# Patient Record
Sex: Male | Born: 1945 | Race: White | Hispanic: No | Marital: Married
Health system: Southern US, Community
[De-identification: ages and names within clinical notes are randomized; demographics above are authoritative.]

---

## 2006-06-28 ENCOUNTER — Encounter: Admission: RE | Admit: 2006-06-28 | Discharge: 2006-06-28 | Payer: Self-pay | Admitting: Internal Medicine

## 2007-03-22 ENCOUNTER — Ambulatory Visit: Admission: RE | Admit: 2007-03-22 | Discharge: 2007-06-20 | Payer: Self-pay | Admitting: Radiation Oncology

## 2007-03-25 ENCOUNTER — Encounter: Admission: RE | Admit: 2007-03-25 | Discharge: 2007-03-25 | Payer: Self-pay | Admitting: Urology

## 2007-05-26 ENCOUNTER — Ambulatory Visit (HOSPITAL_BASED_OUTPATIENT_CLINIC_OR_DEPARTMENT_OTHER): Admission: RE | Admit: 2007-05-26 | Discharge: 2007-05-26 | Payer: Self-pay | Admitting: Urology

## 2007-06-27 ENCOUNTER — Ambulatory Visit: Admission: RE | Admit: 2007-06-27 | Discharge: 2007-08-04 | Payer: Self-pay | Admitting: Radiation Oncology

## 2010-06-24 NOTE — Op Note (Signed)
[name], [name]              ACCOUNT NO.:  [id number]   MEDICAL RECORD NO.:  [id number]          PATIENT TYPE:  AMB   LOCATION:  [facility]                         FACILITY:  [facility]   PHYSICIAN:  Excell Seltzer. Annabell Howells, M.D.    DATE OF BIRTH:  [birth year]   DATE OF PROCEDURE:  DATE OF DISCHARGE:                               OPERATIVE REPORT   PROCEDURE:  Radioactive prostate seed implantation and cystoscopy.   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   SURGEON:  Dr. Bjorn Pippin.   RADIATION ONCOLOGIST:  Dr. Margaretmary Bayley.   DRAIN:  Foley catheter.   COMPLICATIONS:  None.   INDICATIONS:  Joe French is a 65 year old white male who was diagnosed with  a T1C Gleason 6 adenocarcinoma involving the left apex of his prostate  following evaluation of an elevated PSA.  He elected seed implantation  for therapy.   FINDINGS AND PROCEDURE:  The patient was given Cipro, he was taken to  the operating room where PAS hose were placed. General anesthetic was  induced. He was placed in lithotomy position.  His genitalia was prepped  with Betadine solution. A 16-French Foley catheter was inserted,  the  balloon was filled with dilute contrast. The scrotum was then retracted  cephalad with an Opsite dressing.   At this point, Dr. Kathrynn Running placed the ultrasound probe on Nucleotron  base and performed the real-time plan.   Once the plan had been completed, I came into the procedure and placed  the needles according to the intraoperative plan. The needles were  loaded and retracted with the Nucleotron. A total of 82 seeds using 23  needles were placed without complications.   After completion of the seed implantation, the stabilization needles and  probe were removed.  The fluoroscopy was used to obtain images of the  seeds for later seed count.   The Foley catheter was removed, the genitalia was  reprepped with  Betadine and cystoscopy was performed using the 16-French flexible  scope.  Examination revealed a normal urethra.  The external sphincter  was intact.  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  Examination of the bladder revealed one tiny clot in  the bladder but no retained seeds or active bleeding.  Ureteral orifices  were unremarkable.  No tumors were seen.   A fresh 16-French Foley catheter was inserted.  The balloon was deflated  with 10 mL sterile fluid and the catheter was placed to straight  drainage.  The patient was taken down from lithotomy position after his  perineum was cleansed and dressed.  His anesthetic was reversed, he was  removed to the recovery room in stable condition.  There were no  complications.      Excell Seltzer. Annabell Howells, M.D.  Electronically Signed     JJW/MEDQ  D:  05/26/2007  T:  05/26/2007  Job:  [id number]   cc:   Artist Pais Kathrynn Running, M.D.  Fax: [phone]   Thora Lance, M.D.  Fax: [phone]

## 2010-11-04 LAB — COMPREHENSIVE METABOLIC PANEL
AST: 31
Albumin: 4.1
Alkaline Phosphatase: 68
Chloride: 104
Creatinine, Ser: 0.98
GFR calc Af Amer: >60
Potassium: 4.3
Total Bilirubin: 1.1
Total Protein: 6.9

## 2010-11-04 LAB — APTT: aPTT: 29

## 2010-11-04 LAB — PROTIME-INR: INR: 0.8

## 2010-11-04 LAB — CBC
Platelets: 209
RDW: 12.8
WBC: 6.1

## 2012-09-20 DIAGNOSIS — H2510 Age-related nuclear cataract, unspecified eye: Secondary | ICD-10-CM | POA: Diagnosis not present

## 2012-09-20 DIAGNOSIS — H43819 Vitreous degeneration, unspecified eye: Secondary | ICD-10-CM | POA: Diagnosis not present

## 2012-09-20 DIAGNOSIS — H25019 Cortical age-related cataract, unspecified eye: Secondary | ICD-10-CM | POA: Diagnosis not present

## 2012-10-05 DIAGNOSIS — H2589 Other age-related cataract: Secondary | ICD-10-CM | POA: Diagnosis not present

## 2012-10-05 DIAGNOSIS — H52209 Unspecified astigmatism, unspecified eye: Secondary | ICD-10-CM | POA: Diagnosis not present

## 2012-10-05 DIAGNOSIS — H25019 Cortical age-related cataract, unspecified eye: Secondary | ICD-10-CM | POA: Diagnosis not present

## 2012-10-05 DIAGNOSIS — H2510 Age-related nuclear cataract, unspecified eye: Secondary | ICD-10-CM | POA: Diagnosis not present

## 2012-10-12 DIAGNOSIS — N529 Male erectile dysfunction, unspecified: Secondary | ICD-10-CM | POA: Diagnosis not present

## 2012-10-12 DIAGNOSIS — Z8546 Personal history of malignant neoplasm of prostate: Secondary | ICD-10-CM | POA: Diagnosis not present

## 2012-11-22 DIAGNOSIS — M25579 Pain in unspecified ankle and joints of unspecified foot: Secondary | ICD-10-CM | POA: Diagnosis not present

## 2012-11-22 DIAGNOSIS — M25519 Pain in unspecified shoulder: Secondary | ICD-10-CM | POA: Diagnosis not present

## 2012-12-08 DIAGNOSIS — M549 Dorsalgia, unspecified: Secondary | ICD-10-CM | POA: Diagnosis not present

## 2012-12-08 DIAGNOSIS — R404 Transient alteration of awareness: Secondary | ICD-10-CM | POA: Diagnosis not present

## 2012-12-08 DIAGNOSIS — M109 Gout, unspecified: Secondary | ICD-10-CM | POA: Diagnosis not present

## 2012-12-08 DIAGNOSIS — Z23 Encounter for immunization: Secondary | ICD-10-CM | POA: Diagnosis not present

## 2012-12-08 DIAGNOSIS — I10 Essential (primary) hypertension: Secondary | ICD-10-CM | POA: Diagnosis not present

## 2012-12-08 DIAGNOSIS — E785 Hyperlipidemia, unspecified: Secondary | ICD-10-CM | POA: Diagnosis not present

## 2012-12-08 DIAGNOSIS — M25519 Pain in unspecified shoulder: Secondary | ICD-10-CM | POA: Diagnosis not present

## 2012-12-08 DIAGNOSIS — Z0000 Encounter for general adult medical examination without abnormal findings: Secondary | ICD-10-CM | POA: Diagnosis not present

## 2012-12-08 DIAGNOSIS — Z1331 Encounter for screening for depression: Secondary | ICD-10-CM | POA: Diagnosis not present

## 2012-12-28 DIAGNOSIS — G4710 Hypersomnia, unspecified: Secondary | ICD-10-CM | POA: Diagnosis not present

## 2012-12-28 DIAGNOSIS — G4733 Obstructive sleep apnea (adult) (pediatric): Secondary | ICD-10-CM | POA: Diagnosis not present

## 2012-12-28 DIAGNOSIS — G4730 Sleep apnea, unspecified: Secondary | ICD-10-CM | POA: Diagnosis not present

## 2012-12-28 DIAGNOSIS — R0609 Other forms of dyspnea: Secondary | ICD-10-CM | POA: Diagnosis not present

## 2012-12-29 DIAGNOSIS — M25579 Pain in unspecified ankle and joints of unspecified foot: Secondary | ICD-10-CM | POA: Diagnosis not present

## 2013-02-20 DIAGNOSIS — M545 Low back pain, unspecified: Secondary | ICD-10-CM | POA: Diagnosis not present

## 2013-02-20 DIAGNOSIS — E7800 Pure hypercholesterolemia, unspecified: Secondary | ICD-10-CM | POA: Diagnosis not present

## 2013-02-20 DIAGNOSIS — M47817 Spondylosis without myelopathy or radiculopathy, lumbosacral region: Secondary | ICD-10-CM | POA: Diagnosis not present

## 2013-02-20 DIAGNOSIS — Z79899 Other long term (current) drug therapy: Secondary | ICD-10-CM | POA: Diagnosis not present

## 2013-02-20 DIAGNOSIS — M109 Gout, unspecified: Secondary | ICD-10-CM | POA: Diagnosis not present

## 2013-02-20 DIAGNOSIS — E669 Obesity, unspecified: Secondary | ICD-10-CM | POA: Diagnosis not present

## 2013-02-20 DIAGNOSIS — IMO0002 Reserved for concepts with insufficient information to code with codable children: Secondary | ICD-10-CM | POA: Diagnosis not present

## 2013-02-23 ENCOUNTER — Other Ambulatory Visit: Payer: Self-pay | Admitting: Neurosurgery

## 2013-02-23 DIAGNOSIS — M4310 Spondylolisthesis, site unspecified: Secondary | ICD-10-CM

## 2013-03-13 ENCOUNTER — Ambulatory Visit
Admission: RE | Admit: 2013-03-13 | Discharge: 2013-03-13 | Disposition: A | Discharge status: Home / Self Care | Payer: Medicare Other | Source: Ambulatory Visit | Attending: Neurosurgery | Admitting: Neurosurgery

## 2013-03-13 DIAGNOSIS — M5126 Other intervertebral disc displacement, lumbar region: Secondary | ICD-10-CM | POA: Diagnosis not present

## 2013-03-13 DIAGNOSIS — M47817 Spondylosis without myelopathy or radiculopathy, lumbosacral region: Secondary | ICD-10-CM | POA: Diagnosis not present

## 2013-03-13 DIAGNOSIS — M4310 Spondylolisthesis, site unspecified: Secondary | ICD-10-CM

## 2013-03-16 DIAGNOSIS — G4733 Obstructive sleep apnea (adult) (pediatric): Secondary | ICD-10-CM | POA: Diagnosis not present

## 2013-03-21 DIAGNOSIS — M7500 Adhesive capsulitis of unspecified shoulder: Secondary | ICD-10-CM | POA: Diagnosis not present

## 2013-03-21 DIAGNOSIS — M67919 Unspecified disorder of synovium and tendon, unspecified shoulder: Secondary | ICD-10-CM | POA: Diagnosis not present

## 2013-03-30 DIAGNOSIS — D126 Benign neoplasm of colon, unspecified: Secondary | ICD-10-CM | POA: Diagnosis not present

## 2013-03-30 DIAGNOSIS — Z86010 Personal history of colonic polyps: Secondary | ICD-10-CM | POA: Diagnosis not present

## 2013-03-30 DIAGNOSIS — K5730 Diverticulosis of large intestine without perforation or abscess without bleeding: Secondary | ICD-10-CM | POA: Diagnosis not present

## 2013-03-30 DIAGNOSIS — Z09 Encounter for follow-up examination after completed treatment for conditions other than malignant neoplasm: Secondary | ICD-10-CM | POA: Diagnosis not present

## 2013-04-24 DIAGNOSIS — H1045 Other chronic allergic conjunctivitis: Secondary | ICD-10-CM | POA: Diagnosis not present

## 2013-05-01 DIAGNOSIS — IMO0002 Reserved for concepts with insufficient information to code with codable children: Secondary | ICD-10-CM | POA: Diagnosis not present

## 2013-05-01 DIAGNOSIS — M7130 Other bursal cyst, unspecified site: Secondary | ICD-10-CM | POA: Diagnosis not present

## 2013-05-01 DIAGNOSIS — Z6832 Body mass index (BMI) 32.0-32.9, adult: Secondary | ICD-10-CM | POA: Diagnosis not present

## 2013-05-01 DIAGNOSIS — M47817 Spondylosis without myelopathy or radiculopathy, lumbosacral region: Secondary | ICD-10-CM | POA: Diagnosis not present

## 2013-05-30 DIAGNOSIS — M66369 Spontaneous rupture of flexor tendons, unspecified lower leg: Secondary | ICD-10-CM | POA: Diagnosis not present

## 2013-06-06 DIAGNOSIS — I10 Essential (primary) hypertension: Secondary | ICD-10-CM | POA: Diagnosis not present

## 2013-06-06 DIAGNOSIS — M109 Gout, unspecified: Secondary | ICD-10-CM | POA: Diagnosis not present

## 2013-06-06 DIAGNOSIS — G4733 Obstructive sleep apnea (adult) (pediatric): Secondary | ICD-10-CM | POA: Diagnosis not present

## 2013-06-07 DIAGNOSIS — M7730 Calcaneal spur, unspecified foot: Secondary | ICD-10-CM | POA: Diagnosis not present

## 2013-06-07 DIAGNOSIS — S96819A Strain of other specified muscles and tendons at ankle and foot level, unspecified foot, initial encounter: Secondary | ICD-10-CM | POA: Diagnosis not present

## 2013-06-07 DIAGNOSIS — M898X9 Other specified disorders of bone, unspecified site: Secondary | ICD-10-CM | POA: Diagnosis not present

## 2013-06-07 DIAGNOSIS — M9380 Other specified osteochondropathies of unspecified site: Secondary | ICD-10-CM | POA: Diagnosis not present

## 2013-06-07 DIAGNOSIS — G8918 Other acute postprocedural pain: Secondary | ICD-10-CM | POA: Diagnosis not present

## 2013-06-07 DIAGNOSIS — M6240 Contracture of muscle, unspecified site: Secondary | ICD-10-CM | POA: Diagnosis not present

## 2013-06-07 DIAGNOSIS — S93499A Sprain of other ligament of unspecified ankle, initial encounter: Secondary | ICD-10-CM | POA: Diagnosis not present

## 2013-06-07 DIAGNOSIS — M7660 Achilles tendinitis, unspecified leg: Secondary | ICD-10-CM | POA: Diagnosis not present

## 2013-06-07 DIAGNOSIS — M66369 Spontaneous rupture of flexor tendons, unspecified lower leg: Secondary | ICD-10-CM | POA: Diagnosis not present

## 2013-06-15 DIAGNOSIS — Z4789 Encounter for other orthopedic aftercare: Secondary | ICD-10-CM | POA: Diagnosis not present

## 2013-06-22 DIAGNOSIS — Z4789 Encounter for other orthopedic aftercare: Secondary | ICD-10-CM | POA: Diagnosis not present

## 2013-06-26 DIAGNOSIS — Z6832 Body mass index (BMI) 32.0-32.9, adult: Secondary | ICD-10-CM | POA: Diagnosis not present

## 2013-06-26 DIAGNOSIS — M542 Cervicalgia: Secondary | ICD-10-CM | POA: Diagnosis not present

## 2013-06-26 DIAGNOSIS — M546 Pain in thoracic spine: Secondary | ICD-10-CM | POA: Diagnosis not present

## 2013-06-26 DIAGNOSIS — S139XXA Sprain of joints and ligaments of unspecified parts of neck, initial encounter: Secondary | ICD-10-CM | POA: Diagnosis not present

## 2013-08-02 DIAGNOSIS — R262 Difficulty in walking, not elsewhere classified: Secondary | ICD-10-CM | POA: Diagnosis not present

## 2013-08-02 DIAGNOSIS — M66369 Spontaneous rupture of flexor tendons, unspecified lower leg: Secondary | ICD-10-CM | POA: Diagnosis not present

## 2013-08-07 DIAGNOSIS — M75120 Complete rotator cuff tear or rupture of unspecified shoulder, not specified as traumatic: Secondary | ICD-10-CM | POA: Diagnosis not present

## 2013-08-07 DIAGNOSIS — M25519 Pain in unspecified shoulder: Secondary | ICD-10-CM | POA: Diagnosis not present

## 2013-08-07 DIAGNOSIS — M6281 Muscle weakness (generalized): Secondary | ICD-10-CM | POA: Diagnosis not present

## 2013-08-07 DIAGNOSIS — M25619 Stiffness of unspecified shoulder, not elsewhere classified: Secondary | ICD-10-CM | POA: Diagnosis not present

## 2013-08-09 DIAGNOSIS — R262 Difficulty in walking, not elsewhere classified: Secondary | ICD-10-CM | POA: Diagnosis not present

## 2013-08-09 DIAGNOSIS — M66369 Spontaneous rupture of flexor tendons, unspecified lower leg: Secondary | ICD-10-CM | POA: Diagnosis not present

## 2013-08-16 DIAGNOSIS — M66369 Spontaneous rupture of flexor tendons, unspecified lower leg: Secondary | ICD-10-CM | POA: Diagnosis not present

## 2013-08-16 DIAGNOSIS — R262 Difficulty in walking, not elsewhere classified: Secondary | ICD-10-CM | POA: Diagnosis not present

## 2013-08-22 DIAGNOSIS — M66369 Spontaneous rupture of flexor tendons, unspecified lower leg: Secondary | ICD-10-CM | POA: Diagnosis not present

## 2013-08-22 DIAGNOSIS — R262 Difficulty in walking, not elsewhere classified: Secondary | ICD-10-CM | POA: Diagnosis not present

## 2013-09-07 DIAGNOSIS — R262 Difficulty in walking, not elsewhere classified: Secondary | ICD-10-CM | POA: Diagnosis not present

## 2013-09-07 DIAGNOSIS — M66369 Spontaneous rupture of flexor tendons, unspecified lower leg: Secondary | ICD-10-CM | POA: Diagnosis not present

## 2013-12-07 DIAGNOSIS — G4733 Obstructive sleep apnea (adult) (pediatric): Secondary | ICD-10-CM | POA: Diagnosis not present

## 2013-12-07 DIAGNOSIS — N183 Chronic kidney disease, stage 3 (moderate): Secondary | ICD-10-CM | POA: Diagnosis not present

## 2013-12-07 DIAGNOSIS — E780 Pure hypercholesterolemia: Secondary | ICD-10-CM | POA: Diagnosis not present

## 2013-12-07 DIAGNOSIS — M109 Gout, unspecified: Secondary | ICD-10-CM | POA: Diagnosis not present

## 2013-12-07 DIAGNOSIS — I129 Hypertensive chronic kidney disease with stage 1 through stage 4 chronic kidney disease, or unspecified chronic kidney disease: Secondary | ICD-10-CM | POA: Diagnosis not present

## 2013-12-07 DIAGNOSIS — M549 Dorsalgia, unspecified: Secondary | ICD-10-CM | POA: Diagnosis not present

## 2013-12-07 DIAGNOSIS — Z23 Encounter for immunization: Secondary | ICD-10-CM | POA: Diagnosis not present

## 2013-12-11 DIAGNOSIS — M549 Dorsalgia, unspecified: Secondary | ICD-10-CM | POA: Diagnosis not present

## 2013-12-13 DIAGNOSIS — M549 Dorsalgia, unspecified: Secondary | ICD-10-CM | POA: Diagnosis not present

## 2013-12-18 DIAGNOSIS — M549 Dorsalgia, unspecified: Secondary | ICD-10-CM | POA: Diagnosis not present

## 2013-12-20 DIAGNOSIS — M549 Dorsalgia, unspecified: Secondary | ICD-10-CM | POA: Diagnosis not present

## 2013-12-27 DIAGNOSIS — Z8546 Personal history of malignant neoplasm of prostate: Secondary | ICD-10-CM | POA: Diagnosis not present

## 2013-12-29 DIAGNOSIS — M549 Dorsalgia, unspecified: Secondary | ICD-10-CM | POA: Diagnosis not present

## 2014-01-03 DIAGNOSIS — M549 Dorsalgia, unspecified: Secondary | ICD-10-CM | POA: Diagnosis not present

## 2014-01-03 DIAGNOSIS — Z8546 Personal history of malignant neoplasm of prostate: Secondary | ICD-10-CM | POA: Diagnosis not present

## 2014-01-03 DIAGNOSIS — N5201 Erectile dysfunction due to arterial insufficiency: Secondary | ICD-10-CM | POA: Diagnosis not present

## 2014-01-08 DIAGNOSIS — M549 Dorsalgia, unspecified: Secondary | ICD-10-CM | POA: Diagnosis not present

## 2014-01-10 DIAGNOSIS — M549 Dorsalgia, unspecified: Secondary | ICD-10-CM | POA: Diagnosis not present

## 2014-12-10 DIAGNOSIS — M109 Gout, unspecified: Secondary | ICD-10-CM | POA: Diagnosis not present

## 2014-12-10 DIAGNOSIS — Z23 Encounter for immunization: Secondary | ICD-10-CM | POA: Diagnosis not present

## 2014-12-10 DIAGNOSIS — M7552 Bursitis of left shoulder: Secondary | ICD-10-CM | POA: Diagnosis not present

## 2014-12-10 DIAGNOSIS — Z1389 Encounter for screening for other disorder: Secondary | ICD-10-CM | POA: Diagnosis not present

## 2014-12-10 DIAGNOSIS — G4733 Obstructive sleep apnea (adult) (pediatric): Secondary | ICD-10-CM | POA: Diagnosis not present

## 2014-12-10 DIAGNOSIS — E7800 Pure hypercholesterolemia, unspecified: Secondary | ICD-10-CM | POA: Diagnosis not present

## 2014-12-10 DIAGNOSIS — N183 Chronic kidney disease, stage 3 (moderate): Secondary | ICD-10-CM | POA: Diagnosis not present

## 2014-12-10 DIAGNOSIS — Z0000 Encounter for general adult medical examination without abnormal findings: Secondary | ICD-10-CM | POA: Diagnosis not present

## 2014-12-10 DIAGNOSIS — I129 Hypertensive chronic kidney disease with stage 1 through stage 4 chronic kidney disease, or unspecified chronic kidney disease: Secondary | ICD-10-CM | POA: Diagnosis not present

## 2014-12-10 DIAGNOSIS — M7551 Bursitis of right shoulder: Secondary | ICD-10-CM | POA: Diagnosis not present

## 2015-01-09 DIAGNOSIS — Z8546 Personal history of malignant neoplasm of prostate: Secondary | ICD-10-CM | POA: Diagnosis not present

## 2015-01-16 DIAGNOSIS — N5201 Erectile dysfunction due to arterial insufficiency: Secondary | ICD-10-CM | POA: Diagnosis not present

## 2015-01-16 DIAGNOSIS — Z8546 Personal history of malignant neoplasm of prostate: Secondary | ICD-10-CM | POA: Diagnosis not present

## 2015-08-20 DIAGNOSIS — M791 Myalgia: Secondary | ICD-10-CM | POA: Diagnosis not present

## 2015-08-20 DIAGNOSIS — I129 Hypertensive chronic kidney disease with stage 1 through stage 4 chronic kidney disease, or unspecified chronic kidney disease: Secondary | ICD-10-CM | POA: Diagnosis not present

## 2015-08-20 DIAGNOSIS — M109 Gout, unspecified: Secondary | ICD-10-CM | POA: Diagnosis not present

## 2015-08-20 DIAGNOSIS — G4733 Obstructive sleep apnea (adult) (pediatric): Secondary | ICD-10-CM | POA: Diagnosis not present

## 2015-08-20 DIAGNOSIS — N183 Chronic kidney disease, stage 3 (moderate): Secondary | ICD-10-CM | POA: Diagnosis not present

## 2016-01-15 DIAGNOSIS — Z8546 Personal history of malignant neoplasm of prostate: Secondary | ICD-10-CM | POA: Diagnosis not present

## 2016-01-22 DIAGNOSIS — Z8546 Personal history of malignant neoplasm of prostate: Secondary | ICD-10-CM | POA: Diagnosis not present

## 2016-01-22 DIAGNOSIS — R351 Nocturia: Secondary | ICD-10-CM | POA: Diagnosis not present

## 2016-01-22 DIAGNOSIS — N5201 Erectile dysfunction due to arterial insufficiency: Secondary | ICD-10-CM | POA: Diagnosis not present

## 2016-04-16 DIAGNOSIS — N183 Chronic kidney disease, stage 3 (moderate): Secondary | ICD-10-CM | POA: Diagnosis not present

## 2016-04-16 DIAGNOSIS — M109 Gout, unspecified: Secondary | ICD-10-CM | POA: Diagnosis not present

## 2016-04-16 DIAGNOSIS — Z1389 Encounter for screening for other disorder: Secondary | ICD-10-CM | POA: Diagnosis not present

## 2016-04-16 DIAGNOSIS — E7800 Pure hypercholesterolemia, unspecified: Secondary | ICD-10-CM | POA: Diagnosis not present

## 2016-04-16 DIAGNOSIS — Z0000 Encounter for general adult medical examination without abnormal findings: Secondary | ICD-10-CM | POA: Diagnosis not present

## 2016-04-16 DIAGNOSIS — G4733 Obstructive sleep apnea (adult) (pediatric): Secondary | ICD-10-CM | POA: Diagnosis not present

## 2016-04-16 DIAGNOSIS — I129 Hypertensive chronic kidney disease with stage 1 through stage 4 chronic kidney disease, or unspecified chronic kidney disease: Secondary | ICD-10-CM | POA: Diagnosis not present

## 2016-05-25 DIAGNOSIS — L814 Other melanin hyperpigmentation: Secondary | ICD-10-CM | POA: Diagnosis not present

## 2016-05-25 DIAGNOSIS — L570 Actinic keratosis: Secondary | ICD-10-CM | POA: Diagnosis not present

## 2016-05-25 DIAGNOSIS — D1801 Hemangioma of skin and subcutaneous tissue: Secondary | ICD-10-CM | POA: Diagnosis not present

## 2016-05-25 DIAGNOSIS — M7130 Other bursal cyst, unspecified site: Secondary | ICD-10-CM | POA: Diagnosis not present

## 2016-05-25 DIAGNOSIS — D225 Melanocytic nevi of trunk: Secondary | ICD-10-CM | POA: Diagnosis not present

## 2016-05-25 DIAGNOSIS — L821 Other seborrheic keratosis: Secondary | ICD-10-CM | POA: Diagnosis not present

## 2016-10-28 DIAGNOSIS — E7800 Pure hypercholesterolemia, unspecified: Secondary | ICD-10-CM | POA: Diagnosis not present

## 2016-10-28 DIAGNOSIS — Z23 Encounter for immunization: Secondary | ICD-10-CM | POA: Diagnosis not present

## 2016-10-28 DIAGNOSIS — I129 Hypertensive chronic kidney disease with stage 1 through stage 4 chronic kidney disease, or unspecified chronic kidney disease: Secondary | ICD-10-CM | POA: Diagnosis not present

## 2016-10-28 DIAGNOSIS — G4733 Obstructive sleep apnea (adult) (pediatric): Secondary | ICD-10-CM | POA: Diagnosis not present

## 2017-04-12 DIAGNOSIS — Z8546 Personal history of malignant neoplasm of prostate: Secondary | ICD-10-CM | POA: Diagnosis not present

## 2017-04-22 DIAGNOSIS — Z8546 Personal history of malignant neoplasm of prostate: Secondary | ICD-10-CM | POA: Diagnosis not present

## 2017-04-22 DIAGNOSIS — R351 Nocturia: Secondary | ICD-10-CM | POA: Diagnosis not present

## 2017-04-22 DIAGNOSIS — N5201 Erectile dysfunction due to arterial insufficiency: Secondary | ICD-10-CM | POA: Diagnosis not present

## 2017-04-23 DIAGNOSIS — Z1389 Encounter for screening for other disorder: Secondary | ICD-10-CM | POA: Diagnosis not present

## 2017-04-23 DIAGNOSIS — I129 Hypertensive chronic kidney disease with stage 1 through stage 4 chronic kidney disease, or unspecified chronic kidney disease: Secondary | ICD-10-CM | POA: Diagnosis not present

## 2017-04-23 DIAGNOSIS — M109 Gout, unspecified: Secondary | ICD-10-CM | POA: Diagnosis not present

## 2017-04-23 DIAGNOSIS — M5431 Sciatica, right side: Secondary | ICD-10-CM | POA: Diagnosis not present

## 2017-04-23 DIAGNOSIS — N183 Chronic kidney disease, stage 3 (moderate): Secondary | ICD-10-CM | POA: Diagnosis not present

## 2017-04-23 DIAGNOSIS — E7800 Pure hypercholesterolemia, unspecified: Secondary | ICD-10-CM | POA: Diagnosis not present

## 2017-04-23 DIAGNOSIS — G4733 Obstructive sleep apnea (adult) (pediatric): Secondary | ICD-10-CM | POA: Diagnosis not present

## 2017-04-23 DIAGNOSIS — Z0000 Encounter for general adult medical examination without abnormal findings: Secondary | ICD-10-CM | POA: Diagnosis not present

## 2017-05-12 DIAGNOSIS — J029 Acute pharyngitis, unspecified: Secondary | ICD-10-CM | POA: Diagnosis not present

## 2017-05-25 ENCOUNTER — Other Ambulatory Visit: Payer: Self-pay | Admitting: Internal Medicine

## 2017-05-25 DIAGNOSIS — M5431 Sciatica, right side: Secondary | ICD-10-CM

## 2017-06-04 ENCOUNTER — Ambulatory Visit
Admission: RE | Admit: 2017-06-04 | Discharge: 2017-06-04 | Disposition: A | Discharge status: Home / Self Care | Payer: Medicare Other | Source: Ambulatory Visit | Attending: Internal Medicine | Admitting: Internal Medicine

## 2017-06-04 DIAGNOSIS — M5431 Sciatica, right side: Secondary | ICD-10-CM

## 2017-06-05 ENCOUNTER — Ambulatory Visit
Admission: RE | Admit: 2017-06-05 | Discharge: 2017-06-05 | Disposition: A | Discharge status: Home / Self Care | Payer: Medicare Other | Source: Ambulatory Visit | Attending: Internal Medicine | Admitting: Internal Medicine

## 2017-06-05 DIAGNOSIS — M48061 Spinal stenosis, lumbar region without neurogenic claudication: Secondary | ICD-10-CM | POA: Diagnosis not present

## 2017-07-14 DIAGNOSIS — M4810 Ankylosing hyperostosis [Forestier], site unspecified: Secondary | ICD-10-CM | POA: Diagnosis not present

## 2017-07-14 DIAGNOSIS — M5416 Radiculopathy, lumbar region: Secondary | ICD-10-CM | POA: Diagnosis not present

## 2017-07-14 DIAGNOSIS — M545 Low back pain: Secondary | ICD-10-CM | POA: Diagnosis not present

## 2017-07-14 DIAGNOSIS — Z6835 Body mass index (BMI) 35.0-35.9, adult: Secondary | ICD-10-CM | POA: Diagnosis not present

## 2017-07-14 DIAGNOSIS — I10 Essential (primary) hypertension: Secondary | ICD-10-CM | POA: Diagnosis not present

## 2017-07-14 DIAGNOSIS — M4126 Other idiopathic scoliosis, lumbar region: Secondary | ICD-10-CM | POA: Diagnosis not present

## 2017-07-14 DIAGNOSIS — M5126 Other intervertebral disc displacement, lumbar region: Secondary | ICD-10-CM | POA: Diagnosis not present

## 2017-07-28 DIAGNOSIS — M25551 Pain in right hip: Secondary | ICD-10-CM | POA: Diagnosis not present

## 2017-07-28 DIAGNOSIS — M545 Low back pain: Secondary | ICD-10-CM | POA: Diagnosis not present

## 2017-08-03 DIAGNOSIS — M545 Low back pain: Secondary | ICD-10-CM | POA: Diagnosis not present

## 2017-08-03 DIAGNOSIS — M25551 Pain in right hip: Secondary | ICD-10-CM | POA: Diagnosis not present

## 2017-08-17 DIAGNOSIS — M25551 Pain in right hip: Secondary | ICD-10-CM | POA: Diagnosis not present

## 2017-08-17 DIAGNOSIS — M545 Low back pain: Secondary | ICD-10-CM | POA: Diagnosis not present

## 2017-08-24 DIAGNOSIS — M25551 Pain in right hip: Secondary | ICD-10-CM | POA: Diagnosis not present

## 2017-08-24 DIAGNOSIS — M545 Low back pain: Secondary | ICD-10-CM | POA: Diagnosis not present

## 2017-08-26 DIAGNOSIS — M545 Low back pain: Secondary | ICD-10-CM | POA: Diagnosis not present

## 2017-08-26 DIAGNOSIS — M25551 Pain in right hip: Secondary | ICD-10-CM | POA: Diagnosis not present

## 2017-09-02 DIAGNOSIS — M545 Low back pain: Secondary | ICD-10-CM | POA: Diagnosis not present

## 2017-09-02 DIAGNOSIS — M25551 Pain in right hip: Secondary | ICD-10-CM | POA: Diagnosis not present

## 2017-09-14 DIAGNOSIS — M545 Low back pain: Secondary | ICD-10-CM | POA: Diagnosis not present

## 2017-09-14 DIAGNOSIS — M25551 Pain in right hip: Secondary | ICD-10-CM | POA: Diagnosis not present

## 2017-09-16 DIAGNOSIS — Z961 Presence of intraocular lens: Secondary | ICD-10-CM | POA: Diagnosis not present

## 2017-09-16 DIAGNOSIS — H26493 Other secondary cataract, bilateral: Secondary | ICD-10-CM | POA: Diagnosis not present

## 2017-09-16 DIAGNOSIS — H5201 Hypermetropia, right eye: Secondary | ICD-10-CM | POA: Diagnosis not present

## 2017-09-17 DIAGNOSIS — H43813 Vitreous degeneration, bilateral: Secondary | ICD-10-CM | POA: Diagnosis not present

## 2017-09-17 DIAGNOSIS — H26493 Other secondary cataract, bilateral: Secondary | ICD-10-CM | POA: Diagnosis not present

## 2017-09-17 DIAGNOSIS — H1789 Other corneal scars and opacities: Secondary | ICD-10-CM | POA: Diagnosis not present

## 2017-09-21 DIAGNOSIS — M25551 Pain in right hip: Secondary | ICD-10-CM | POA: Diagnosis not present

## 2017-09-21 DIAGNOSIS — M545 Low back pain: Secondary | ICD-10-CM | POA: Diagnosis not present

## 2017-09-23 DIAGNOSIS — M25551 Pain in right hip: Secondary | ICD-10-CM | POA: Diagnosis not present

## 2017-09-23 DIAGNOSIS — M545 Low back pain: Secondary | ICD-10-CM | POA: Diagnosis not present

## 2017-10-19 DIAGNOSIS — M25551 Pain in right hip: Secondary | ICD-10-CM | POA: Diagnosis not present

## 2017-10-19 DIAGNOSIS — M545 Low back pain: Secondary | ICD-10-CM | POA: Diagnosis not present

## 2017-10-28 DIAGNOSIS — H26492 Other secondary cataract, left eye: Secondary | ICD-10-CM | POA: Diagnosis not present

## 2017-11-11 DIAGNOSIS — H26491 Other secondary cataract, right eye: Secondary | ICD-10-CM | POA: Diagnosis not present

## 2018-01-26 DIAGNOSIS — Z23 Encounter for immunization: Secondary | ICD-10-CM | POA: Diagnosis not present

## 2018-08-05 DIAGNOSIS — Z0000 Encounter for general adult medical examination without abnormal findings: Secondary | ICD-10-CM | POA: Diagnosis not present

## 2018-08-05 DIAGNOSIS — Z1389 Encounter for screening for other disorder: Secondary | ICD-10-CM | POA: Diagnosis not present

## 2018-09-01 DIAGNOSIS — I129 Hypertensive chronic kidney disease with stage 1 through stage 4 chronic kidney disease, or unspecified chronic kidney disease: Secondary | ICD-10-CM | POA: Diagnosis not present

## 2018-09-01 DIAGNOSIS — G4733 Obstructive sleep apnea (adult) (pediatric): Secondary | ICD-10-CM | POA: Diagnosis not present

## 2018-09-01 DIAGNOSIS — E7800 Pure hypercholesterolemia, unspecified: Secondary | ICD-10-CM | POA: Diagnosis not present

## 2018-09-01 DIAGNOSIS — N183 Chronic kidney disease, stage 3 (moderate): Secondary | ICD-10-CM | POA: Diagnosis not present

## 2018-09-01 DIAGNOSIS — M109 Gout, unspecified: Secondary | ICD-10-CM | POA: Diagnosis not present

## 2018-09-01 DIAGNOSIS — Z5181 Encounter for therapeutic drug level monitoring: Secondary | ICD-10-CM | POA: Diagnosis not present

## 2018-09-01 DIAGNOSIS — D126 Benign neoplasm of colon, unspecified: Secondary | ICD-10-CM | POA: Diagnosis not present

## 2018-09-06 DIAGNOSIS — M20012 Mallet finger of left finger(s): Secondary | ICD-10-CM | POA: Diagnosis not present

## 2018-09-06 DIAGNOSIS — M79645 Pain in left finger(s): Secondary | ICD-10-CM | POA: Diagnosis not present

## 2018-09-06 DIAGNOSIS — M25642 Stiffness of left hand, not elsewhere classified: Secondary | ICD-10-CM | POA: Diagnosis not present

## 2018-09-16 DIAGNOSIS — Z5181 Encounter for therapeutic drug level monitoring: Secondary | ICD-10-CM | POA: Diagnosis not present

## 2018-09-23 ENCOUNTER — Other Ambulatory Visit: Payer: Self-pay | Admitting: Internal Medicine

## 2018-09-23 DIAGNOSIS — N183 Chronic kidney disease, stage 3 unspecified: Secondary | ICD-10-CM

## 2018-10-06 ENCOUNTER — Ambulatory Visit
Admission: RE | Admit: 2018-10-06 | Discharge: 2018-10-06 | Disposition: A | Discharge status: Home / Self Care | Payer: Medicare Other | Source: Ambulatory Visit | Attending: Internal Medicine | Admitting: Internal Medicine

## 2018-10-06 DIAGNOSIS — N183 Chronic kidney disease, stage 3 unspecified: Secondary | ICD-10-CM

## 2018-10-21 DIAGNOSIS — M20012 Mallet finger of left finger(s): Secondary | ICD-10-CM | POA: Diagnosis not present

## 2018-10-21 DIAGNOSIS — M79645 Pain in left finger(s): Secondary | ICD-10-CM | POA: Diagnosis not present

## 2018-10-21 DIAGNOSIS — M25642 Stiffness of left hand, not elsewhere classified: Secondary | ICD-10-CM | POA: Diagnosis not present

## 2018-11-02 DIAGNOSIS — M25642 Stiffness of left hand, not elsewhere classified: Secondary | ICD-10-CM | POA: Diagnosis not present

## 2018-11-02 DIAGNOSIS — M20012 Mallet finger of left finger(s): Secondary | ICD-10-CM | POA: Diagnosis not present

## 2019-01-02 DIAGNOSIS — N189 Chronic kidney disease, unspecified: Secondary | ICD-10-CM | POA: Diagnosis not present

## 2019-03-01 ENCOUNTER — Ambulatory Visit: Payer: Medicare Other | Attending: Internal Medicine

## 2019-03-01 DIAGNOSIS — Z23 Encounter for immunization: Secondary | ICD-10-CM | POA: Insufficient documentation

## 2019-03-20 ENCOUNTER — Ambulatory Visit: Payer: Medicare Other | Attending: Internal Medicine

## 2019-03-20 DIAGNOSIS — Z23 Encounter for immunization: Secondary | ICD-10-CM | POA: Insufficient documentation

## 2019-03-20 NOTE — Progress Notes (Signed)
   Covid-19 Vaccination Clinic  Name:  Joe French    MRN: [id number] DOB: Aug 02, 1945  03/20/2019  Mr. Joe French was observed post Covid-19 immunization for 15 minutes without incidence. He was provided with Vaccine Information Sheet and instruction to access the V-Safe system.   Mr. Joe French was instructed to call 911 with any severe reactions post vaccine: Marland Kitchen Difficulty breathing  . Swelling of your face and throat  . A fast heartbeat  . A bad rash all over your body  . Dizziness and weakness    Immunizations Administered    Name Date Dose VIS Date Route   Pfizer COVID-19 Vaccine 03/20/2019  2:21 PM 0.3 mL 01/20/2019 Intramuscular   Manufacturer: [place]   [place]ot: [id number]   [place]: [id number]

## 2019-04-26 DIAGNOSIS — I129 Hypertensive chronic kidney disease with stage 1 through stage 4 chronic kidney disease, or unspecified chronic kidney disease: Secondary | ICD-10-CM | POA: Diagnosis not present

## 2019-04-26 DIAGNOSIS — J301 Allergic rhinitis due to pollen: Secondary | ICD-10-CM | POA: Diagnosis not present

## 2019-04-26 DIAGNOSIS — G4733 Obstructive sleep apnea (adult) (pediatric): Secondary | ICD-10-CM | POA: Diagnosis not present

## 2019-04-26 DIAGNOSIS — N1831 Chronic kidney disease, stage 3a: Secondary | ICD-10-CM | POA: Diagnosis not present

## 2019-04-26 DIAGNOSIS — M109 Gout, unspecified: Secondary | ICD-10-CM | POA: Diagnosis not present

## 2019-06-29 IMAGING — MR MR LUMBAR SPINE W/O CM
4 of 5 series · 25 of 48 positions shown · non-contrast
Comparison: Prior MRI from 03/13/2013.

CLINICAL DATA: Initial evaluation for chronic low back pain,
anterior right thigh pain. History of prostate cancer.

EXAM:
MRI LUMBAR SPINE WITHOUT CONTRAST
TECHNIQUE: Multiplanar, multisequence MR imaging of the lumbar spine was
performed. No intravenous contrast was administered.

[Series 3: T2 post-contrast · sagittal · 4.0mm · 0.55mm/px · 6 of 15 slices shown]
[im 1/15]
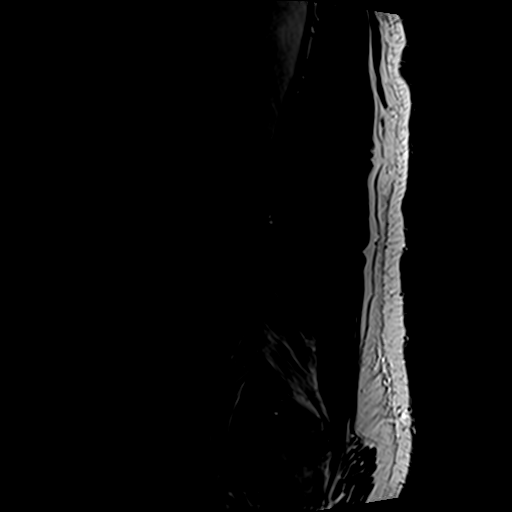
[im 3/15]
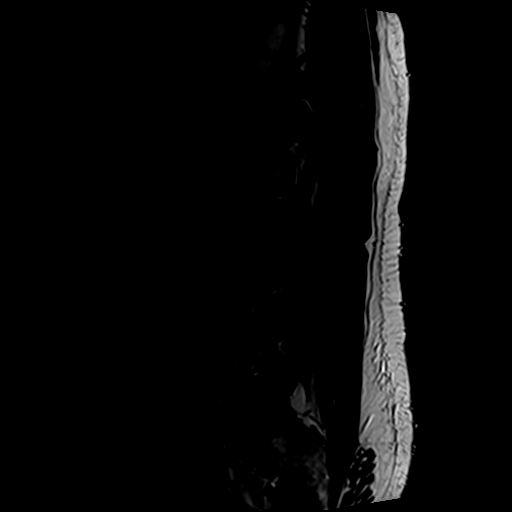
[im 6/15]
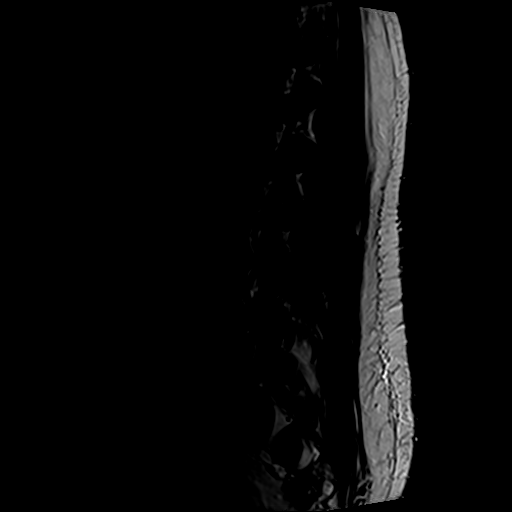
[im 9/15]
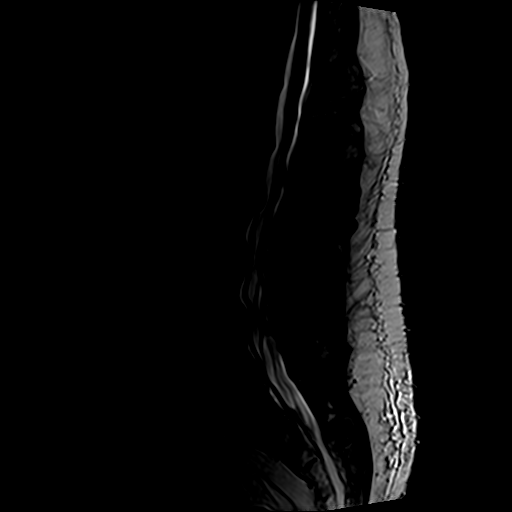
[im 12/15]
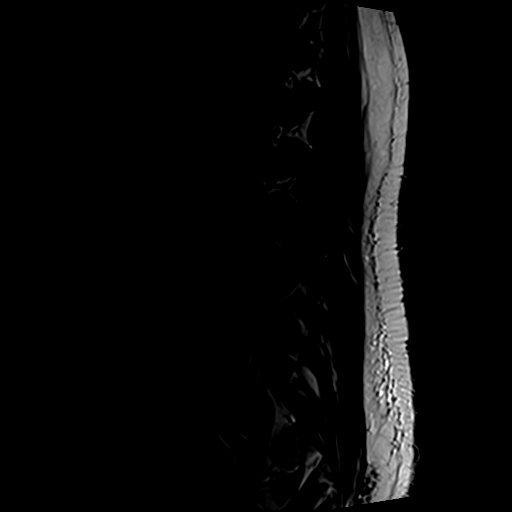
[im 15/15]
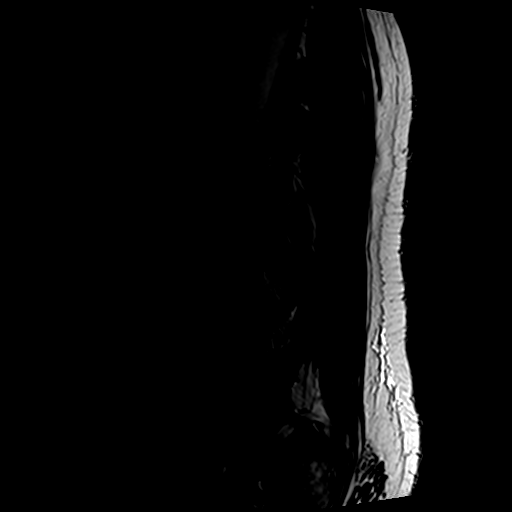

[Series 5: T1 · sagittal · 4.0mm · 0.55mm/px · 6 of 15 slices shown (1 of 2)]
[im 1/15]
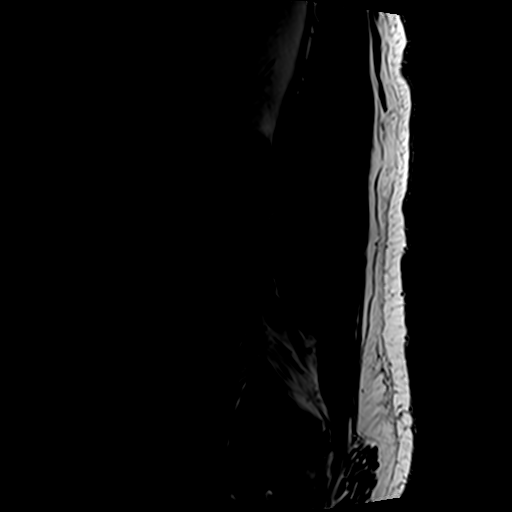
[im 3/15]
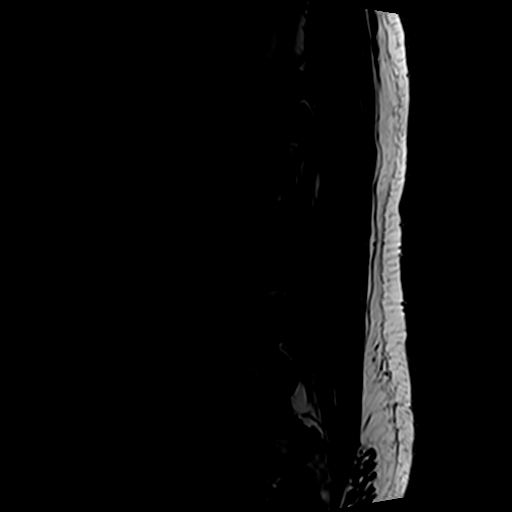
[im 6/15]
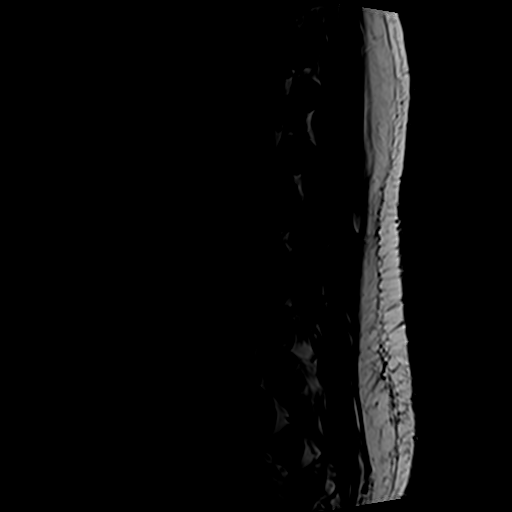
[im 9/15]
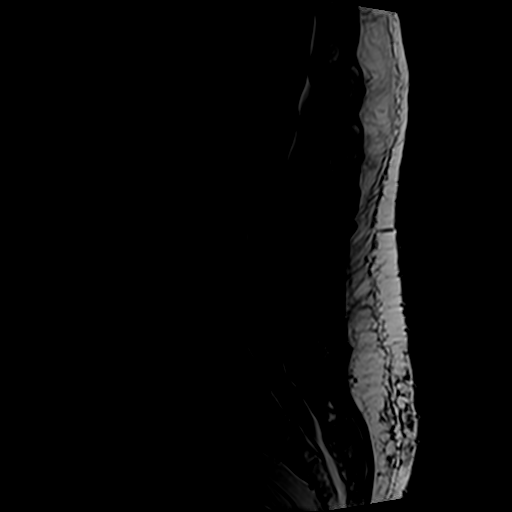
[im 12/15]
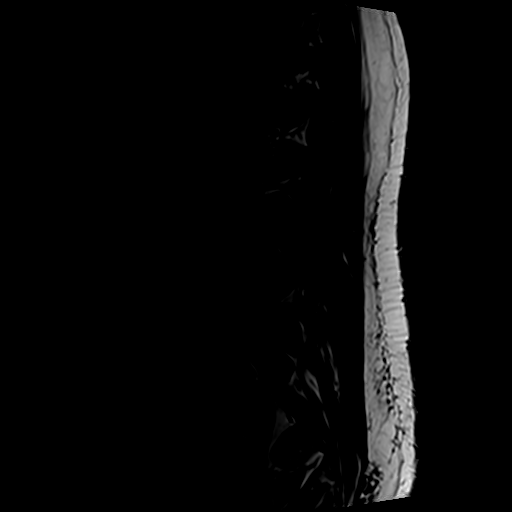
[im 15/15]
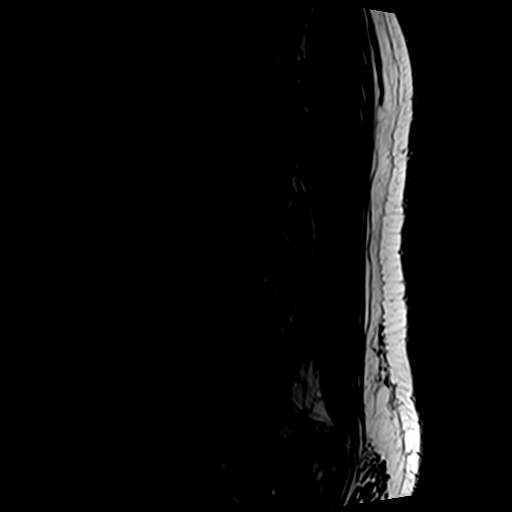

[Series 6: T2 · axial · 4.0mm · 0.70mm/px · z∈[-104,+99]mm · 9 of 35 slices shown]
[im 1/35]
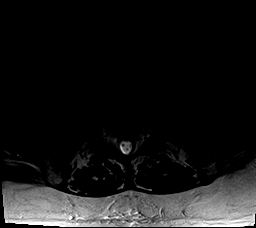
[im 5/35]
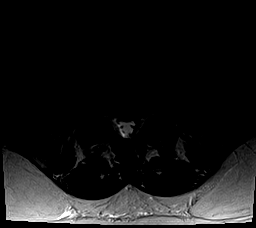
[im 10/35]
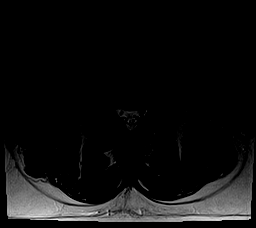
[im 15/35]
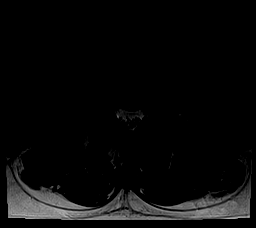
[im 18/35]
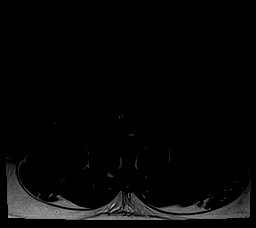
[im 20/35]
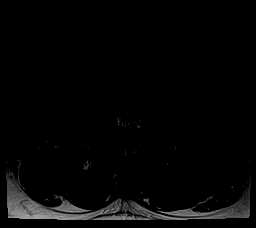
[im 25/35]
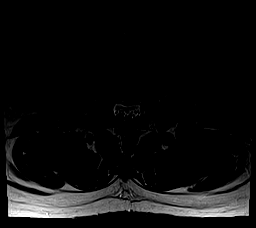
[im 30/35]
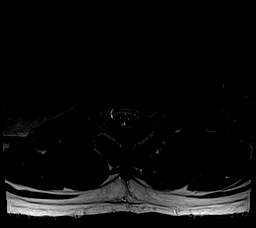
[im 35/35]
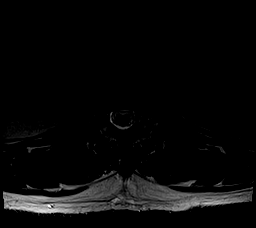

[Series 7: T1 · axial · 4.0mm · 0.35mm/px · z∈[-104,+67]mm · 4 of 35 slices shown (2 of 2)]
[im 1/35]
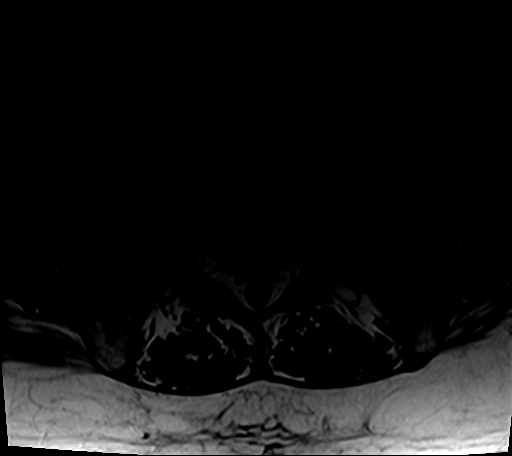
[im 5/35]
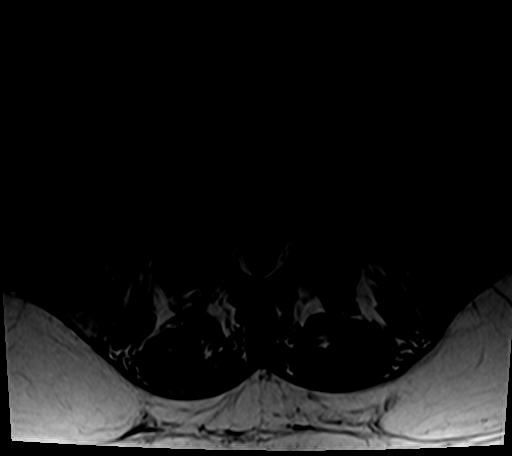
[im 18/35]
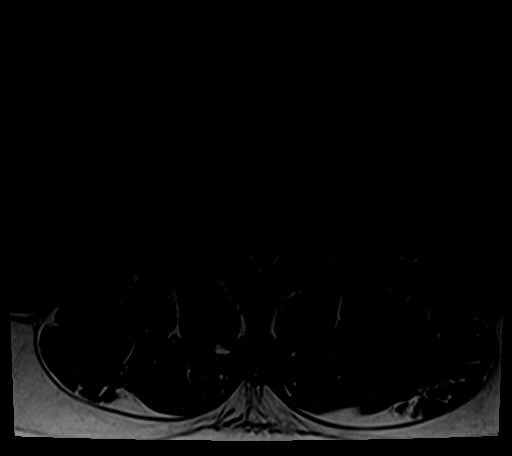
[im 30/35]
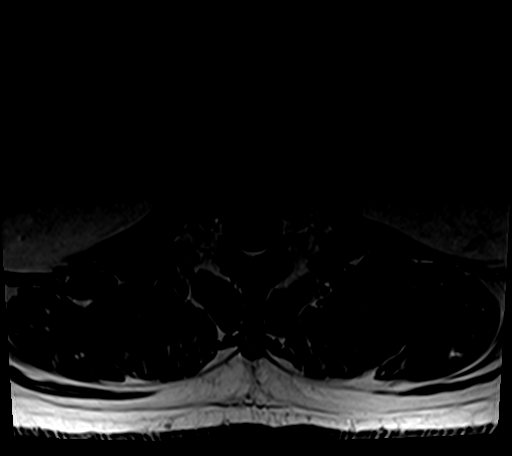

[25 of 48 positions shown; findings below may reference images not displayed]

FINDINGS: Segmentation: Normal segmentation. Lowest well-formed disc labeled
the L5-S1 level.

Alignment: Normal alignment with preservation of the normal lumbar
lordosis. No listhesis.

Vertebrae: Vertebral body heights well maintained without evidence
for acute or chronic fracture. Bone marrow signal intensity within
normal limits. No worrisome osseous lesions. Minimal reactive
endplate changes noted at the inferior endplate of L4.

Conus medullaris and cauda equina: Conus extends to the L1 level.
Conus and cauda equina appear normal.

Paraspinal and other soft tissues: Paraspinous soft tissues within
normal limits. Few scattered renal cysts noted, largest of which
measures 4 cm on the left. Visualized visceral structures otherwise
unremarkable.

Disc levels:

L1-2: Mild diffuse disc bulge. Superimposed small central disc
protrusion indents the ventral thecal sac. Mild bilateral facet and
ligament flavum hypertrophy. No significant canal or foraminal
stenosis. Changes have mildly progressed from previous.

L2-3: Diffuse disc bulge with chronic intervertebral disc space
narrowing. Posterior endplate osseous ridging. Resultant broad
posterior disc osteophyte flattens and partially indents the ventral
thecal sac, greater on the right. Mild bilateral facet and ligament
flavum hypertrophy. Resultant mild canal with bilateral lateral
recess narrowing, slightly worse on the right, progressed from
previous. Foramina remain patent.

L3-4: Diffuse disc bulge. Moderate facet and ligament flavum
hypertrophy, slightly worse on the right. Resultant mild canal with
bilateral lateral recess narrowing, slightly progressed from
previous. Mild bilateral L3 foraminal narrowing.

L4-5: Diffuse disc bulge with intervertebral disc space narrowing.
Moderate facet and ligament flavum hypertrophy. Resulting mild canal
with mild to moderate bilateral lateral recess narrowing, right
worse than left. Previously seen synovial cyst no longer discretely
visualized. Moderate bilateral L4 foraminal stenosis, left greater
than right.

L5-S1: Shallow posterior disc bulge. Mild facet hypertrophy. No
canal or foraminal stenosis.
IMPRESSION: 1. Mildly progressive degenerative spondylolysis at L1-2 through
L4-5 with resultant mild diffuse spinal stenosis with mild to
moderate bilateral lateral recess narrowing as above, most notable
at the L4-5 level.
2. Mild to moderate bilateral L3 and L4 foraminal stenosis related
to disc bulge and facet hypertrophy.

## 2019-08-21 DIAGNOSIS — N1831 Chronic kidney disease, stage 3a: Secondary | ICD-10-CM | POA: Diagnosis not present

## 2019-08-21 DIAGNOSIS — I129 Hypertensive chronic kidney disease with stage 1 through stage 4 chronic kidney disease, or unspecified chronic kidney disease: Secondary | ICD-10-CM | POA: Diagnosis not present

## 2019-08-21 DIAGNOSIS — Z8546 Personal history of malignant neoplasm of prostate: Secondary | ICD-10-CM | POA: Diagnosis not present

## 2019-08-21 DIAGNOSIS — I10 Essential (primary) hypertension: Secondary | ICD-10-CM | POA: Diagnosis not present

## 2019-08-21 DIAGNOSIS — N189 Chronic kidney disease, unspecified: Secondary | ICD-10-CM | POA: Diagnosis not present

## 2019-08-21 DIAGNOSIS — E7800 Pure hypercholesterolemia, unspecified: Secondary | ICD-10-CM | POA: Diagnosis not present

## 2019-09-05 DIAGNOSIS — E7800 Pure hypercholesterolemia, unspecified: Secondary | ICD-10-CM | POA: Diagnosis not present

## 2019-09-05 DIAGNOSIS — I129 Hypertensive chronic kidney disease with stage 1 through stage 4 chronic kidney disease, or unspecified chronic kidney disease: Secondary | ICD-10-CM | POA: Diagnosis not present

## 2019-09-05 DIAGNOSIS — Z1389 Encounter for screening for other disorder: Secondary | ICD-10-CM | POA: Diagnosis not present

## 2019-09-05 DIAGNOSIS — M109 Gout, unspecified: Secondary | ICD-10-CM | POA: Diagnosis not present

## 2019-09-05 DIAGNOSIS — Z86010 Personal history of colonic polyps: Secondary | ICD-10-CM | POA: Diagnosis not present

## 2019-09-05 DIAGNOSIS — G4733 Obstructive sleep apnea (adult) (pediatric): Secondary | ICD-10-CM | POA: Diagnosis not present

## 2019-09-05 DIAGNOSIS — Z23 Encounter for immunization: Secondary | ICD-10-CM | POA: Diagnosis not present

## 2019-09-05 DIAGNOSIS — Z0000 Encounter for general adult medical examination without abnormal findings: Secondary | ICD-10-CM | POA: Diagnosis not present

## 2019-09-05 DIAGNOSIS — Z8546 Personal history of malignant neoplasm of prostate: Secondary | ICD-10-CM | POA: Diagnosis not present

## 2019-09-05 DIAGNOSIS — N1831 Chronic kidney disease, stage 3a: Secondary | ICD-10-CM | POA: Diagnosis not present

## 2019-11-14 ENCOUNTER — Ambulatory Visit: Payer: Medicare Other | Attending: Internal Medicine

## 2019-11-14 ENCOUNTER — Ambulatory Visit: Payer: Self-pay

## 2019-11-14 DIAGNOSIS — Z23 Encounter for immunization: Secondary | ICD-10-CM

## 2019-11-14 NOTE — Progress Notes (Signed)
   Covid-19 Vaccination Clinic  Name:  ELIOR ROBINETTE    MRN: [id number] DOB: 03-11-1945  11/14/2019  Mr. Cubero was observed post Covid-19 immunization for 15 minutes without incident. He was provided with Vaccine Information Sheet and instruction to access the V-Safe system.   Mr. Sakuma was instructed to call 911 with any severe reactions post vaccine: Marland Kitchen Difficulty breathing  . Swelling of face and throat  . A fast heartbeat  . A bad rash all over body  . Dizziness and weakness

## 2020-03-08 DIAGNOSIS — G4733 Obstructive sleep apnea (adult) (pediatric): Secondary | ICD-10-CM | POA: Diagnosis not present

## 2020-03-08 DIAGNOSIS — N1831 Chronic kidney disease, stage 3a: Secondary | ICD-10-CM | POA: Diagnosis not present

## 2020-03-08 DIAGNOSIS — I129 Hypertensive chronic kidney disease with stage 1 through stage 4 chronic kidney disease, or unspecified chronic kidney disease: Secondary | ICD-10-CM | POA: Diagnosis not present

## 2020-03-08 DIAGNOSIS — Z23 Encounter for immunization: Secondary | ICD-10-CM | POA: Diagnosis not present

## 2020-03-08 DIAGNOSIS — M109 Gout, unspecified: Secondary | ICD-10-CM | POA: Diagnosis not present

## 2020-03-08 DIAGNOSIS — N529 Male erectile dysfunction, unspecified: Secondary | ICD-10-CM | POA: Diagnosis not present

## 2020-03-11 DIAGNOSIS — I129 Hypertensive chronic kidney disease with stage 1 through stage 4 chronic kidney disease, or unspecified chronic kidney disease: Secondary | ICD-10-CM | POA: Diagnosis not present

## 2020-03-11 DIAGNOSIS — Z8546 Personal history of malignant neoplasm of prostate: Secondary | ICD-10-CM | POA: Diagnosis not present

## 2020-03-11 DIAGNOSIS — N189 Chronic kidney disease, unspecified: Secondary | ICD-10-CM | POA: Diagnosis not present

## 2020-03-11 DIAGNOSIS — I10 Essential (primary) hypertension: Secondary | ICD-10-CM | POA: Diagnosis not present

## 2020-03-11 DIAGNOSIS — N1831 Chronic kidney disease, stage 3a: Secondary | ICD-10-CM | POA: Diagnosis not present

## 2020-03-11 DIAGNOSIS — E7800 Pure hypercholesterolemia, unspecified: Secondary | ICD-10-CM | POA: Diagnosis not present

## 2020-09-09 DIAGNOSIS — Z1389 Encounter for screening for other disorder: Secondary | ICD-10-CM | POA: Diagnosis not present

## 2020-09-09 DIAGNOSIS — E7800 Pure hypercholesterolemia, unspecified: Secondary | ICD-10-CM | POA: Diagnosis not present

## 2020-09-09 DIAGNOSIS — N1831 Chronic kidney disease, stage 3a: Secondary | ICD-10-CM | POA: Diagnosis not present

## 2020-09-09 DIAGNOSIS — Z86010 Personal history of colonic polyps: Secondary | ICD-10-CM | POA: Diagnosis not present

## 2020-09-09 DIAGNOSIS — N529 Male erectile dysfunction, unspecified: Secondary | ICD-10-CM | POA: Diagnosis not present

## 2020-09-09 DIAGNOSIS — M109 Gout, unspecified: Secondary | ICD-10-CM | POA: Diagnosis not present

## 2020-09-09 DIAGNOSIS — Z8546 Personal history of malignant neoplasm of prostate: Secondary | ICD-10-CM | POA: Diagnosis not present

## 2020-09-09 DIAGNOSIS — G4733 Obstructive sleep apnea (adult) (pediatric): Secondary | ICD-10-CM | POA: Diagnosis not present

## 2020-09-09 DIAGNOSIS — I129 Hypertensive chronic kidney disease with stage 1 through stage 4 chronic kidney disease, or unspecified chronic kidney disease: Secondary | ICD-10-CM | POA: Diagnosis not present

## 2020-09-09 DIAGNOSIS — Z0000 Encounter for general adult medical examination without abnormal findings: Secondary | ICD-10-CM | POA: Diagnosis not present

## 2020-10-03 DIAGNOSIS — Z7189 Other specified counseling: Secondary | ICD-10-CM | POA: Diagnosis not present

## 2020-11-18 DIAGNOSIS — Z23 Encounter for immunization: Secondary | ICD-10-CM | POA: Diagnosis not present

## 2020-12-03 DIAGNOSIS — Z23 Encounter for immunization: Secondary | ICD-10-CM | POA: Diagnosis not present

## 2020-12-27 ENCOUNTER — Other Ambulatory Visit: Payer: Self-pay | Admitting: Orthopedic Surgery

## 2020-12-27 DIAGNOSIS — M542 Cervicalgia: Secondary | ICD-10-CM

## 2020-12-27 DIAGNOSIS — M25511 Pain in right shoulder: Secondary | ICD-10-CM | POA: Diagnosis not present

## 2020-12-28 ENCOUNTER — Other Ambulatory Visit: Payer: PRIVATE HEALTH INSURANCE

## 2021-01-16 ENCOUNTER — Other Ambulatory Visit: Payer: PRIVATE HEALTH INSURANCE

## 2021-01-22 ENCOUNTER — Other Ambulatory Visit: Payer: PRIVATE HEALTH INSURANCE

## 2021-02-05 DIAGNOSIS — R635 Abnormal weight gain: Secondary | ICD-10-CM | POA: Diagnosis not present

## 2021-02-05 DIAGNOSIS — R0602 Shortness of breath: Secondary | ICD-10-CM | POA: Diagnosis not present

## 2021-02-05 DIAGNOSIS — R600 Localized edema: Secondary | ICD-10-CM | POA: Diagnosis not present

## 2021-02-05 DIAGNOSIS — N1831 Chronic kidney disease, stage 3a: Secondary | ICD-10-CM | POA: Diagnosis not present

## 2021-02-15 ENCOUNTER — Other Ambulatory Visit: Payer: PRIVATE HEALTH INSURANCE

## 2021-02-25 ENCOUNTER — Other Ambulatory Visit: Payer: PRIVATE HEALTH INSURANCE

## 2021-03-05 ENCOUNTER — Other Ambulatory Visit (HOSPITAL_COMMUNITY): Payer: Self-pay | Admitting: Internal Medicine

## 2021-03-05 DIAGNOSIS — R0609 Other forms of dyspnea: Secondary | ICD-10-CM

## 2021-03-12 ENCOUNTER — Encounter (HOSPITAL_COMMUNITY): Payer: Self-pay | Admitting: Internal Medicine

## 2021-03-12 ENCOUNTER — Other Ambulatory Visit (HOSPITAL_COMMUNITY): Payer: PRIVATE HEALTH INSURANCE

## 2021-03-12 DIAGNOSIS — 419620001 Death: Secondary | SNOMED CT | POA: Diagnosis not present

## 2021-03-12 DEATH — deceased
